# Patient Record
Sex: Female | Born: 1970 | Race: White | Hispanic: No | Smoking: Never smoker
Health system: Southern US, Community
[De-identification: ages and names within clinical notes are randomized; demographics above are authoritative.]

---

## 2005-01-04 ENCOUNTER — Other Ambulatory Visit: Admission: RE | Admit: 2005-01-04 | Discharge: 2005-01-04 | Payer: Self-pay | Admitting: Obstetrics and Gynecology

## 2006-02-12 ENCOUNTER — Other Ambulatory Visit: Admission: RE | Admit: 2006-02-12 | Discharge: 2006-02-12 | Payer: Self-pay | Admitting: Obstetrics and Gynecology

## 2007-07-22 ENCOUNTER — Other Ambulatory Visit: Admission: RE | Admit: 2007-07-22 | Discharge: 2007-07-22 | Payer: Self-pay | Admitting: Obstetrics and Gynecology

## 2008-03-09 ENCOUNTER — Emergency Department (HOSPITAL_COMMUNITY): Admission: EM | Admit: 2008-03-09 | Discharge: 2008-03-10 | Payer: Self-pay | Admitting: Emergency Medicine

## 2008-08-11 ENCOUNTER — Other Ambulatory Visit: Admission: RE | Admit: 2008-08-11 | Discharge: 2008-08-11 | Payer: Self-pay | Admitting: Obstetrics and Gynecology

## 2009-01-14 ENCOUNTER — Other Ambulatory Visit: Admission: RE | Admit: 2009-01-14 | Discharge: 2009-01-14 | Payer: Self-pay | Admitting: Obstetrics and Gynecology

## 2009-04-20 ENCOUNTER — Emergency Department: Payer: Self-pay | Admitting: Emergency Medicine

## 2009-11-02 ENCOUNTER — Inpatient Hospital Stay (HOSPITAL_COMMUNITY): Admission: AD | Admit: 2009-11-02 | Discharge: 2009-11-05 | Payer: Self-pay | Admitting: Obstetrics and Gynecology

## 2009-12-06 ENCOUNTER — Ambulatory Visit: Payer: Self-pay | Admitting: Pediatrics

## 2010-07-24 ENCOUNTER — Ambulatory Visit: Payer: Self-pay | Admitting: Unknown Physician Specialty

## 2011-04-04 LAB — CBC
HCT: 31 % — ABNORMAL LOW (ref 36.0–46.0)
Hemoglobin: 10.4 g/dL — ABNORMAL LOW (ref 12.0–15.0)
Platelets: 190 10*3/uL (ref 150–400)
RBC: 3.17 MIL/uL — ABNORMAL LOW (ref 3.87–5.11)
RBC: 3.67 MIL/uL — ABNORMAL LOW (ref 3.87–5.11)
WBC: 10 10*3/uL (ref 4.0–10.5)
WBC: 16.8 10*3/uL — ABNORMAL HIGH (ref 4.0–10.5)

## 2011-04-04 LAB — RPR: RPR Ser Ql: NONREACTIVE

## 2012-01-02 ENCOUNTER — Other Ambulatory Visit: Payer: Self-pay

## 2012-01-07 LAB — EXPECTORATED SPUTUM ASSESSMENT W GRAM STAIN, RFLX TO RESP C

## 2012-08-14 ENCOUNTER — Ambulatory Visit: Payer: Self-pay | Admitting: Unknown Physician Specialty

## 2013-07-17 ENCOUNTER — Other Ambulatory Visit: Payer: Self-pay | Admitting: Neurology

## 2014-03-11 ENCOUNTER — Other Ambulatory Visit: Payer: Self-pay

## 2014-03-11 ENCOUNTER — Other Ambulatory Visit: Payer: Self-pay | Admitting: Obstetrics and Gynecology

## 2014-03-11 DIAGNOSIS — Z1231 Encounter for screening mammogram for malignant neoplasm of breast: Secondary | ICD-10-CM

## 2014-03-25 ENCOUNTER — Ambulatory Visit
Admission: RE | Admit: 2014-03-25 | Discharge: 2014-03-25 | Disposition: A | Discharge status: Home / Self Care | Payer: Self-pay | Source: Ambulatory Visit | Attending: Obstetrics and Gynecology | Admitting: Obstetrics and Gynecology

## 2014-03-25 ENCOUNTER — Other Ambulatory Visit: Payer: Self-pay

## 2014-03-25 ENCOUNTER — Ambulatory Visit
Admission: RE | Admit: 2014-03-25 | Discharge: 2014-03-25 | Disposition: A | Discharge status: Home / Self Care | Payer: BC Managed Care – PPO | Source: Ambulatory Visit

## 2014-03-25 DIAGNOSIS — Z1231 Encounter for screening mammogram for malignant neoplasm of breast: Secondary | ICD-10-CM

## 2016-06-13 ENCOUNTER — Telehealth: Payer: Self-pay

## 2016-06-13 NOTE — Telephone Encounter (Signed)
Not an active patient- last seen 2013.  NO time for review.
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
## 2016-06-14 NOTE — Telephone Encounter (Signed)
I spoke to [place] at [employer] [employer] [place] [employer]. I advised her that Dr. Vickey Hugerohmeier cannot review the records. I also reminded her that pt was not seen in 2016 by [facility], we last saw the pt in 2013. Shanda BumpsJessica says that she is aware of this mistake.

## 2021-06-29 ENCOUNTER — Telehealth: Payer: Self-pay | Admitting: Neurology

## 2021-06-29 NOTE — Telephone Encounter (Signed)
Patient is being referred for chronic migraines. Last seen Dr. Vickey Huger in 2013. Also saw Dr. Caryn Section and [facility] neurology in 2020. Patient is requesting to switch to Dr. Lucia Gaskins. Please advise if this is acceptable. Thank you

## 2021-10-05 ENCOUNTER — Ambulatory Visit: Payer: Commercial Managed Care - PPO | Admitting: Neurology

## 2021-10-25 ENCOUNTER — Other Ambulatory Visit: Payer: Self-pay | Admitting: Family Medicine

## 2021-10-25 DIAGNOSIS — Z1231 Encounter for screening mammogram for malignant neoplasm of breast: Secondary | ICD-10-CM

## 2021-11-28 ENCOUNTER — Ambulatory Visit
Admission: RE | Admit: 2021-11-28 | Discharge: 2021-11-28 | Disposition: A | Discharge status: Home / Self Care | Payer: Commercial Managed Care - PPO | Source: Ambulatory Visit | Attending: Family Medicine | Admitting: Family Medicine

## 2021-11-28 DIAGNOSIS — Z1231 Encounter for screening mammogram for malignant neoplasm of breast: Secondary | ICD-10-CM

## 2021-12-01 ENCOUNTER — Other Ambulatory Visit: Payer: Self-pay | Admitting: Family Medicine

## 2021-12-01 DIAGNOSIS — R928 Other abnormal and inconclusive findings on diagnostic imaging of breast: Secondary | ICD-10-CM

## 2021-12-21 ENCOUNTER — Ambulatory Visit
Admission: RE | Admit: 2021-12-21 | Discharge: 2021-12-21 | Disposition: A | Discharge status: Home / Self Care | Payer: Commercial Managed Care - PPO | Source: Ambulatory Visit | Attending: Family Medicine | Admitting: Family Medicine

## 2021-12-21 DIAGNOSIS — R928 Other abnormal and inconclusive findings on diagnostic imaging of breast: Secondary | ICD-10-CM

## 2022-10-03 ENCOUNTER — Other Ambulatory Visit (HOSPITAL_BASED_OUTPATIENT_CLINIC_OR_DEPARTMENT_OTHER): Payer: Self-pay

## 2022-10-03 MED ORDER — INFLUENZA VAC SPLIT QUAD 0.5 ML IM SUSY
PREFILLED_SYRINGE | INTRAMUSCULAR | 0 refills | Status: DC
  Filled 2022-10-03: qty 0.5, 1d supply, fill #0

## 2022-10-29 IMAGING — MG MM DIGITAL DIAGNOSTIC UNILAT*R* W/ TOMO W/ CAD
4 series · 4 of 12 positions shown · non-contrast
Comparison: Previous exam(s).

CLINICAL DATA: 42-year-old female recalled from screening mammogram
dated 11/28/2021 for a possible right breast mass.

EXAM:
DIGITAL DIAGNOSTIC UNILATERAL RIGHT MAMMOGRAM WITH TOMOSYNTHESIS AND
CAD; ULTRASOUND RIGHT BREAST LIMITED
TECHNIQUE: Right digital diagnostic mammography and breast tomosynthesis was
performed. The images were evaluated with computer-aided detection.;
Targeted ultrasound examination of the right breast was performed

[R CC synth-2D]
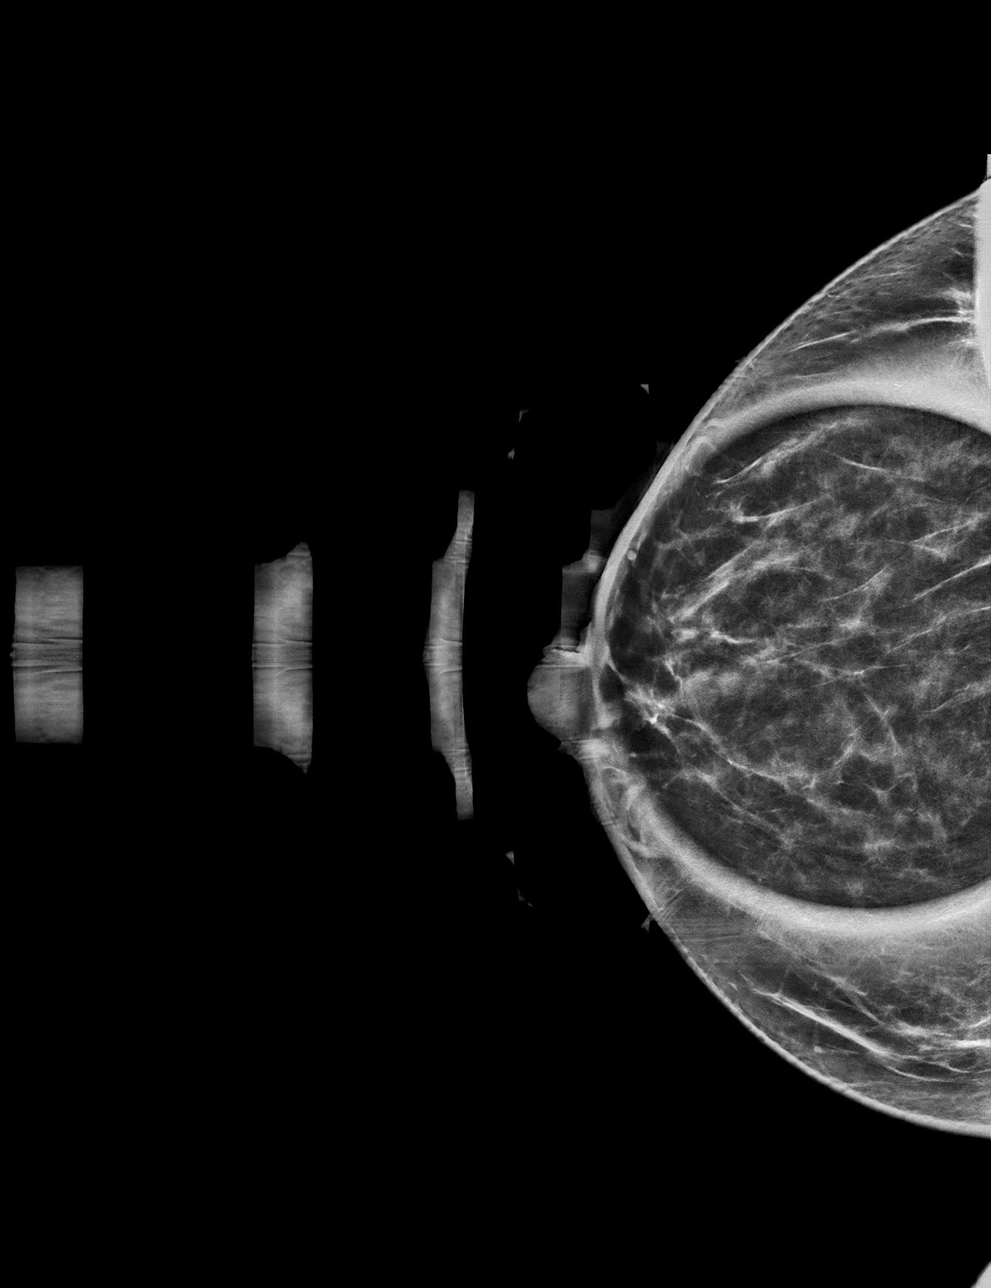

[R MLO synth-2D]
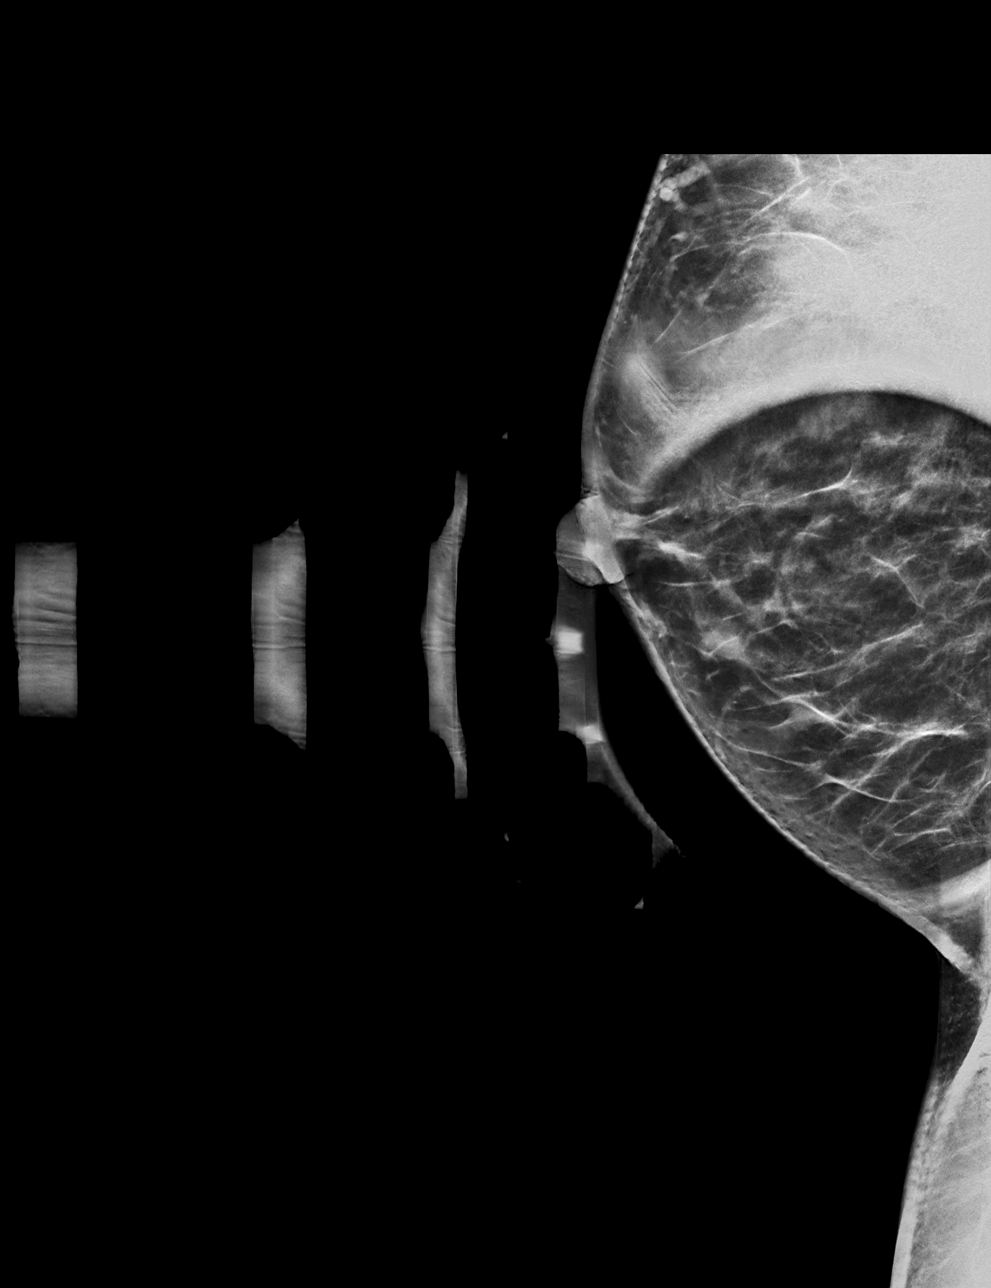

[R MLO tomo · tomo slice 30/59.0]
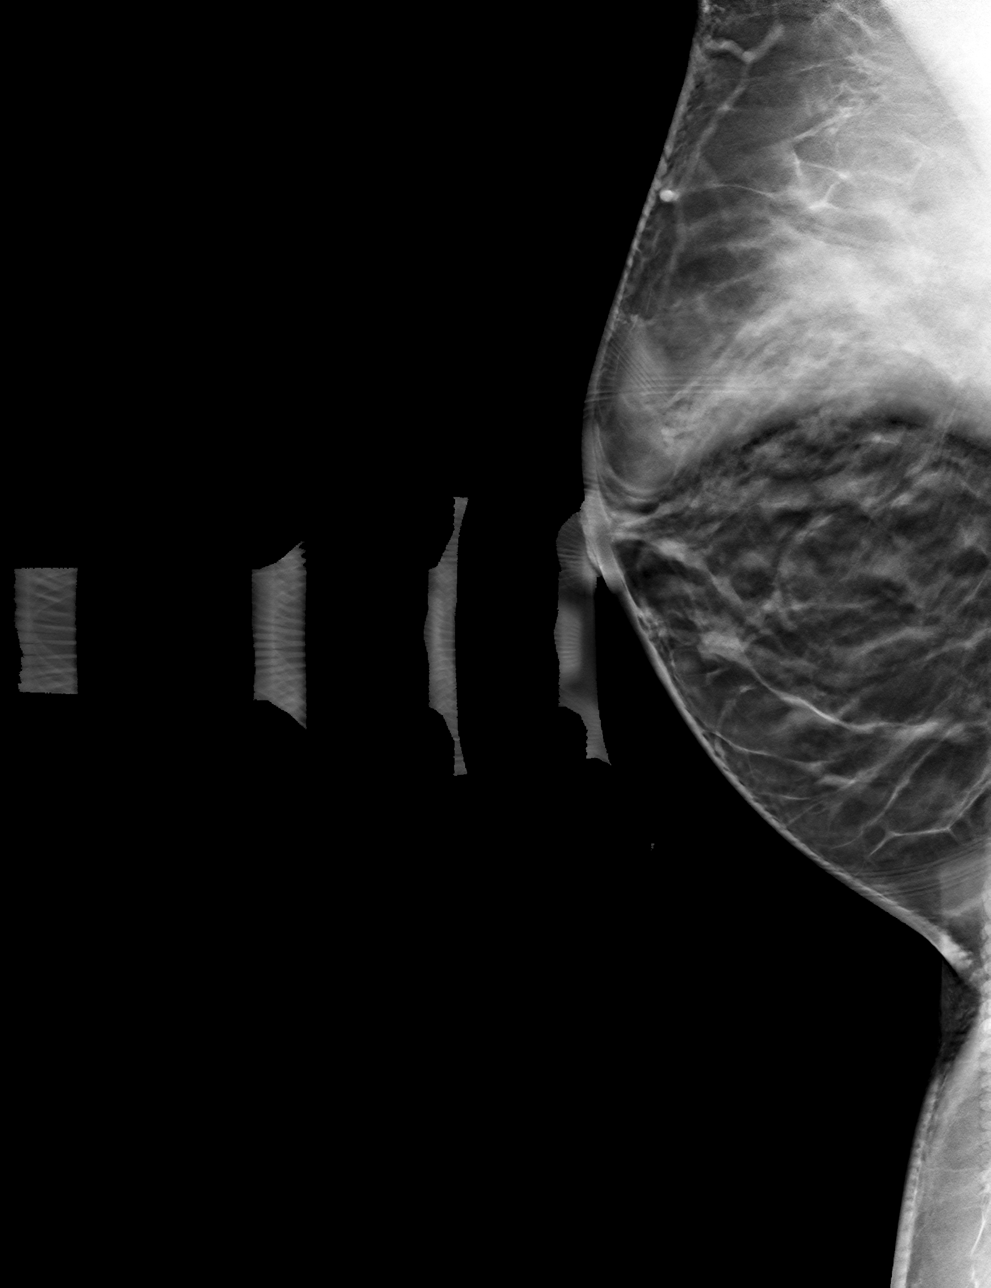

[R CC tomo · tomo slice 31/60.0]
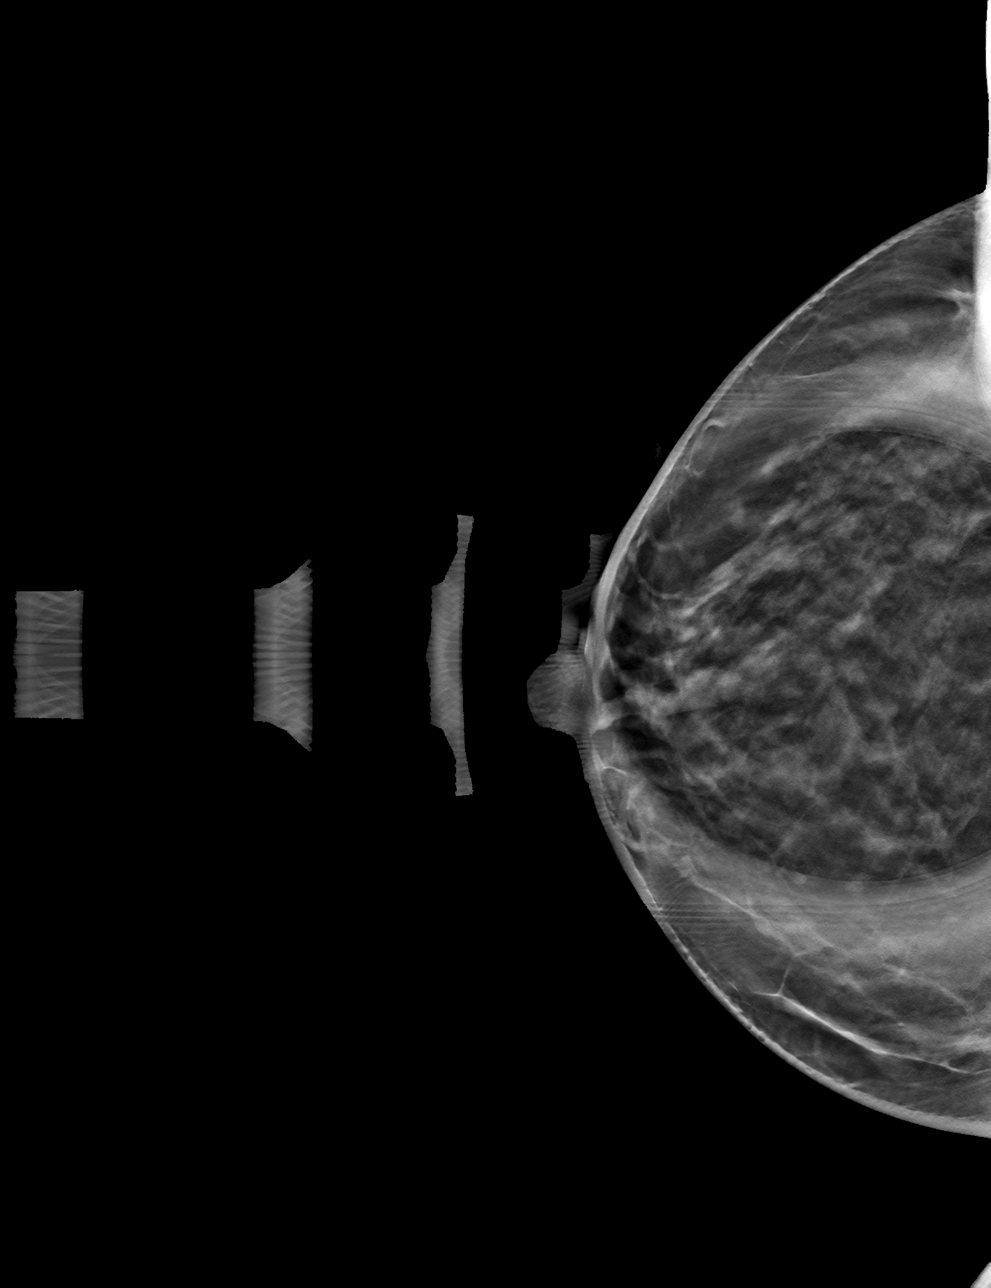

[4 of 12 positions shown; findings below may reference images not displayed]

ACR Breast Density Category c: The breast tissue is heterogeneously
dense, which may obscure small masses.
FINDINGS: There is a persistent serpentine, elongated circumscribed along
inferior right breast at anterior depth. Further evaluation with
ultrasound was performed.

Targeted ultrasound is performed, showing a dilated duct with areas
of focal outpouching along the 7 o'clock position 1 cm from the
nipple. This likely corresponds with the mammographic finding. No
additional suspicious findings throughout the remainder of the
inferior right breast. Numerous other minimally dilated ducts are
noted in the vicinity. No intraductal masses are identified.
IMPRESSION: Multiple benign dilated ducts along the subareolar right breast,
likely corresponding with the mammographic finding. No solid, cystic
or intraductal mass is identified.

RECOMMENDATION:
Screening mammogram in one year.(Code:[id number])

I have discussed the findings and recommendations with the patient.
If applicable, a reminder letter will be sent to the patient
regarding the next appointment.

BI-RADS CATEGORY  2: Benign.

## 2022-12-28 ENCOUNTER — Other Ambulatory Visit: Payer: Self-pay | Admitting: Physician Assistant

## 2022-12-28 DIAGNOSIS — Z1231 Encounter for screening mammogram for malignant neoplasm of breast: Secondary | ICD-10-CM

## 2023-02-18 ENCOUNTER — Ambulatory Visit
Admission: RE | Admit: 2023-02-18 | Discharge: 2023-02-18 | Disposition: A | Discharge status: Home / Self Care | Payer: Commercial Managed Care - PPO | Source: Ambulatory Visit | Attending: Physician Assistant | Admitting: Physician Assistant

## 2023-02-18 DIAGNOSIS — Z1231 Encounter for screening mammogram for malignant neoplasm of breast: Secondary | ICD-10-CM

## 2024-10-06 ENCOUNTER — Other Ambulatory Visit: Payer: Self-pay | Admitting: Adult Health Nurse Practitioner

## 2024-10-06 DIAGNOSIS — Z1231 Encounter for screening mammogram for malignant neoplasm of breast: Secondary | ICD-10-CM

## 2024-10-07 ENCOUNTER — Ambulatory Visit
Admission: RE | Admit: 2024-10-07 | Discharge: 2024-10-07 | Disposition: A | Discharge status: Home / Self Care | Source: Ambulatory Visit

## 2024-10-07 DIAGNOSIS — Z1231 Encounter for screening mammogram for malignant neoplasm of breast: Secondary | ICD-10-CM

## 2024-10-13 ENCOUNTER — Ambulatory Visit (HOSPITAL_COMMUNITY)
Admission: EM | Admit: 2024-10-13 | Discharge: 2024-10-13 | Disposition: A | Discharge status: Other Acute Inpatient Hospital | Attending: Nurse Practitioner | Admitting: Nurse Practitioner

## 2024-10-13 ENCOUNTER — Encounter (HOSPITAL_COMMUNITY): Payer: Self-pay | Admitting: Emergency Medicine

## 2024-10-13 DIAGNOSIS — G4710 Hypersomnia, unspecified: Secondary | ICD-10-CM | POA: Diagnosis not present

## 2024-10-13 DIAGNOSIS — Z79899 Other long term (current) drug therapy: Secondary | ICD-10-CM | POA: Diagnosis not present

## 2024-10-13 DIAGNOSIS — F411 Generalized anxiety disorder: Secondary | ICD-10-CM | POA: Diagnosis not present

## 2024-10-13 DIAGNOSIS — R45851 Suicidal ideations: Secondary | ICD-10-CM | POA: Insufficient documentation

## 2024-10-13 DIAGNOSIS — F332 Major depressive disorder, recurrent severe without psychotic features: Secondary | ICD-10-CM

## 2024-10-13 DIAGNOSIS — Z634 Disappearance and death of family member: Secondary | ICD-10-CM | POA: Diagnosis not present

## 2024-10-13 DIAGNOSIS — Z79890 Hormone replacement therapy (postmenopausal): Secondary | ICD-10-CM | POA: Diagnosis not present

## 2024-10-13 LAB — COMPREHENSIVE METABOLIC PANEL WITH GFR
ALT: 19 U/L (ref 0–44)
AST: 21 U/L (ref 15–41)
Albumin: 4 g/dL (ref 3.5–5.0)
Alkaline Phosphatase: 65 U/L (ref 38–126)
Anion gap: 15 (ref 5–15)
BUN: 16 mg/dL (ref 6–20)
CO2: 23 mmol/L (ref 22–32)
Calcium: 9.1 mg/dL (ref 8.9–10.3)
Chloride: 99 mmol/L (ref 98–111)
Creatinine, Ser: 0.77 mg/dL (ref 0.44–1.00)
GFR, Estimated: >60 mL/min (ref >60)
Glucose, Bld: 54 mg/dL — ABNORMAL LOW (ref 70–99)
Potassium: 4.6 mmol/L (ref 3.5–5.1)
Sodium: 137 mmol/L (ref 135–145)
Total Bilirubin: 0.9 mg/dL (ref 0.0–1.2)
Total Protein: 6.7 g/dL (ref 6.5–8.1)

## 2024-10-13 LAB — CBC WITH DIFFERENTIAL/PLATELET
Abs Immature Granulocytes: 0.02 K/uL (ref 0.00–0.07)
Basophils Absolute: 0 K/uL (ref 0.0–0.1)
Basophils Relative: 0 %
Eosinophils Absolute: 0.1 K/uL (ref 0.0–0.5)
Eosinophils Relative: 1 %
HCT: 45 % (ref 36.0–46.0)
Hemoglobin: 14.6 g/dL (ref 12.0–15.0)
Immature Granulocytes: 0 %
Lymphocytes Relative: 16 %
Lymphs Abs: 1.3 K/uL (ref 0.7–4.0)
MCH: 31.8 pg (ref 26.0–34.0)
MCHC: 32.4 g/dL (ref 30.0–36.0)
MCV: 98 fL (ref 80.0–100.0)
Monocytes Absolute: 0.7 K/uL (ref 0.1–1.0)
Monocytes Relative: 8 %
Neutro Abs: 6.1 K/uL (ref 1.7–7.7)
Neutrophils Relative %: 75 %
Platelets: 294 K/uL (ref 150–400)
RBC: 4.59 MIL/uL (ref 3.87–5.11)
RDW: 13.7 % (ref 11.5–15.5)
WBC: 8.2 K/uL (ref 4.0–10.5)
nRBC: 0 % (ref 0.0–0.2)

## 2024-10-13 LAB — POCT URINE DRUG SCREEN - MANUAL ENTRY (I-SCREEN)
POC Amphetamine UR: NOT DETECTED
POC Buprenorphine (BUP): NOT DETECTED
POC Cocaine UR: NOT DETECTED
POC Marijuana UR: NOT DETECTED
POC Methadone UR: NOT DETECTED
POC Methamphetamine UR: NOT DETECTED
POC Morphine: NOT DETECTED
POC Oxazepam (BZO): NOT DETECTED
POC Oxycodone UR: NOT DETECTED
POC Secobarbital (BAR): NOT DETECTED

## 2024-10-13 LAB — URINALYSIS, ROUTINE W REFLEX MICROSCOPIC
Bacteria, UA: NONE SEEN
Bilirubin Urine: NEGATIVE
Glucose, UA: NEGATIVE mg/dL
Ketones, ur: 5 mg/dL — AB
Leukocytes,Ua: NEGATIVE
Nitrite: NEGATIVE
Protein, ur: NEGATIVE mg/dL
Specific Gravity, Urine: 1.015 (ref 1.005–1.030)
pH: 6 (ref 5.0–8.0)

## 2024-10-13 LAB — LIPID PANEL
Cholesterol: 242 mg/dL — ABNORMAL HIGH (ref 0–200)
HDL: 104 mg/dL (ref >40)
LDL Cholesterol: 122 mg/dL — ABNORMAL HIGH (ref 0–99)
Total CHOL/HDL Ratio: 2.3 ratio
Triglycerides: 79 mg/dL (ref <150)
VLDL: 16 mg/dL (ref 0–40)

## 2024-10-13 LAB — POC URINE PREG, ED: Preg Test, Ur: NEGATIVE

## 2024-10-13 LAB — TSH: TSH: 1.526 u[IU]/mL (ref 0.350–4.500)

## 2024-10-13 LAB — HEMOGLOBIN A1C
Hgb A1c MFr Bld: 4.7 % — ABNORMAL LOW (ref 4.8–5.6)
Mean Plasma Glucose: 88.19 mg/dL

## 2024-10-13 LAB — VITAMIN B12: Vitamin B-12: 463 pg/mL (ref 180–914)

## 2024-10-13 LAB — ETHANOL: Alcohol, Ethyl (B): <15 mg/dL (ref <15)

## 2024-10-13 LAB — VITAMIN D 25 HYDROXY (VIT D DEFICIENCY, FRACTURES): Vit D, 25-Hydroxy: 58.15 ng/mL (ref 30–100)

## 2024-10-13 LAB — MAGNESIUM: Magnesium: 2 mg/dL (ref 1.7–2.4)

## 2024-10-13 MED ORDER — DIPHENHYDRAMINE HCL 50 MG/ML IJ SOLN: 50.0000 mg | Freq: Three times a day (TID) | INTRAMUSCULAR | Status: DC | PRN

## 2024-10-13 MED ORDER — DIPHENHYDRAMINE HCL 50 MG PO CAPS: 50.0000 mg | ORAL_CAPSULE | Freq: Three times a day (TID) | ORAL | Status: DC | PRN

## 2024-10-13 MED ORDER — HALOPERIDOL 5 MG PO TABS: 5.0000 mg | ORAL_TABLET | Freq: Three times a day (TID) | ORAL | Status: DC | PRN

## 2024-10-13 MED ORDER — ACETAMINOPHEN 325 MG PO TABS: 650.0000 mg | ORAL_TABLET | Freq: Four times a day (QID) | ORAL | Status: DC | PRN

## 2024-10-13 MED ORDER — ALUM & MAG HYDROXIDE-SIMETH 200-200-20 MG/5ML PO SUSP: 30.0000 mL | ORAL | Status: DC | PRN

## 2024-10-13 MED ORDER — MAGNESIUM HYDROXIDE 400 MG/5ML PO SUSP: 30.0000 mL | Freq: Every day | ORAL | Status: DC | PRN

## 2024-10-13 MED ORDER — LORAZEPAM 2 MG/ML IJ SOLN: 2.0000 mg | Freq: Three times a day (TID) | INTRAMUSCULAR | Status: DC | PRN

## 2024-10-13 MED ORDER — HALOPERIDOL LACTATE 5 MG/ML IJ SOLN: 5.0000 mg | Freq: Three times a day (TID) | INTRAMUSCULAR | Status: DC | PRN

## 2024-10-13 MED ORDER — FLUOXETINE HCL 20 MG PO CAPS: 40.0000 mg | ORAL_CAPSULE | Freq: Every day | ORAL | Status: DC

## 2024-10-13 MED ORDER — HYDROXYZINE HCL 25 MG PO TABS: 25.0000 mg | ORAL_TABLET | Freq: Three times a day (TID) | ORAL | Status: DC | PRN

## 2024-10-13 MED ORDER — CLONAZEPAM 0.5 MG PO TABS: 0.5000 mg | ORAL_TABLET | Freq: Two times a day (BID) | ORAL | Status: DC | PRN

## 2024-10-13 MED ORDER — TRAZODONE HCL 50 MG PO TABS: 50.0000 mg | ORAL_TABLET | Freq: Every evening | ORAL | Status: DC | PRN

## 2024-10-13 MED ORDER — HALOPERIDOL LACTATE 5 MG/ML IJ SOLN: 10.0000 mg | Freq: Three times a day (TID) | INTRAMUSCULAR | Status: DC | PRN

## 2024-10-13 NOTE — ED Provider Notes (Signed)
 [facility] Continuous Assessment Admission H&P  Date: 10/13/24 Patient Name: Caitlin Castro MRN: [id number] Chief Complaint: Worsening depressive symptoms with suicidal ideations  Diagnoses:  Final diagnoses:  MDD (major depressive disorder), recurrent severe, without psychosis (HCC)  GAD (generalized anxiety disorder)   HPI: Caitlin Castro is a 54 y.o. female with a reported prior mental health diagnoses of MDD and GAD who presents to the [facility] today accompanied by her cousin with complaints of worsening depressive symptoms and anxiety in the context of husband passing away of pancreatic CA in January of this year.  Assessment: During encounter with patient, she reports suicidal ideations with a plan to overdose on pills, reports that depressive symptoms have worsened in the past month, and she began having plans to overdose in the past week.  Patient reports depressive symptoms including hypersomnia, but still feeling very tired.  Reports anhedonia, trouble enjoying laughter, trouble enjoying pickleball which is something that she typically enjoys to do.  Reports decreased energy levels, poor concentration levels, reports weight loss of 25 pounds since January of this year.  Reports that this is related to the decrease in appetite.  Reports trouble thinking clearly, with worsening brain fog.  Reports racing thoughts, shares that earlier today morning, she reached out to her cousin who accompanies her today and told her that she had reached the end of her rope, and could not take it any longer, she had the SI with plan with this cousin who is also an NP, leading to her being brought to the [facility] today.  Patient also reports symptoms consistent with GAD including restlessness, overly worrying, muscle tension, denies symptoms consistent with panic attacks.  Denies psychosis; specifically denies auditory or visual hallucinations, denies paranoia or delusional  thinking, there are no overt signs of psychosis.  Denies first rank symptoms.  Denies mania consistent with bipolar 1 or bipolar type II.  Patient reports drinking alcohol 1 day out of the week, consisting of 2 drinks of bourbon each time.  Denies any other substance use not mentioned here.  Denies any history of abuse.  Reports current stressor is missing her husband, reports that she got busy juggling her 36 year old daughter between volleyball practices, [place], as she was engaged in travel volleyball, and has now had time to process the loss of her husband, and is just beginning to grieve.  Patient reports that she moved from [place]  to this area, stopped working when her husband was diagnosed with pancreatic CA, has not worked since he passed away. Reports started duloxetine at 30 mg daily when her husband was newly diagnosed with pancreatic CA, then Fluoxetine was added to medication regimen in January of this year after he passed away.  Reports that Klonopin was also added which she takes on a as needed basis, and only takes it twice per week.    Patient reports that she is on hormone replacement therapy, which might be adding to her depressive symptoms.  Denies other symptoms consistent with any other mental health conditions.  Recommendations: Suicide Risk Assessment  SUICIDE RISK:  Severe:  Frequent, intense, and enduring suicidal ideation, specific plan prior to hospitalization, no subjective intent, but some objective markers of intent (i.e., choice of lethal method), the method is accessible, some limited preparatory behavior, evidence of impaired self-control, severe dysphoria/symptomatology, multiple risk factors present, even though she has some protective factors, and also social support present.  Patient also has a significant family  history of suicide, and reveals that her sister completed suicide in the context of a bad marriage 10 years ago.  Patient has a  therapist outpatient, but currently is not seeing an outpatient provider for medication management.  She is being seen by her PCP for mental health medication management.  Patient is still at high risk for suicide  Total Time spent with patient: 1.5 hours  Musculoskeletal  Strength & Muscle Tone: within normal limits Gait & Station: normal Patient leans: N/A  Psychiatric Specialty Exam  Presentation General Appearance: Casual  Eye Contact:Fair  Speech:Clear and Coherent  Speech Volume:Normal  Handedness:Right   Mood and Affect  Mood:Depressed  Affect:Appropriate; Congruent   Thought Process  Thought Processes:Coherent  Descriptions of Associations:Intact  Orientation:Full (Time, Place and Person)  Thought Content:Logical    Hallucinations:Hallucinations: None  Ideas of Reference:No data recorded Suicidal Thoughts:Suicidal Thoughts: Yes, Active SI Active Intent and/or Plan: With Plan  Homicidal Thoughts:Homicidal Thoughts: No   Sensorium  Memory:Immediate Fair  Judgment:Good  Insight:Good   Executive Functions  Concentration:Good  Attention Span:Good  Recall:Good  Fund of Knowledge:Good  Language:Good   Psychomotor Activity  Psychomotor Activity:Psychomotor Activity: Normal   Assets  Assets:Desire for Improvement   Sleep  Sleep:Sleep: Poor   Nutritional Assessment (For OBS and [facility]BC admissions only) Has the patient had a weight loss or gain of 10 pounds or more in the last 3 months?: No Has the patient had a decrease in food intake/or appetite?: No Does the patient have dental problems?: No Does the patient have eating habits or behaviors that may be indicators of an eating disorder including binging or inducing vomiting?: No Has the patient recently lost weight without trying?: 0    Physical Exam Vitals and nursing note reviewed.  Eyes:     Pupils: Pupils are equal, round, and reactive to light.  Musculoskeletal:         General: Normal range of motion.     Cervical back: Normal range of motion.  Neurological:     General: No focal deficit present.     Mental Status: She is oriented to person, place, and time.    Review of Systems  Psychiatric/Behavioral:  Positive for depression and suicidal ideas. Negative for hallucinations, memory loss and substance abuse. The patient is nervous/anxious and has insomnia.   All other systems reviewed and are negative.   Blood pressure (!) 120/100, pulse 66, temperature 97.8 F (36.6 C), temperature source Oral, resp. rate 16, SpO2 98%. There is no height or weight on file to calculate BMI.  Past Psychiatric History: MDD, GAD   Is the patient at risk to self? Yes  Has the patient been a risk to self in the past 6 months? Yes .    Has the patient been a risk to self within the distant past? No   Is the patient a risk to others? No   Has the patient been a risk to others in the past 6 months? No   Has the patient been a risk to others within the distant past? No   Past Medical History: denies  Family History: denies  Social History: Has children who are 31, 30 16, 41,14-the oldest 4 are step children.  Last Labs:  Admission on 10/13/2024  Component Date Value Ref Range Status   Preg Test, Ur 10/13/2024 Negative  Negative Final   POC Amphetamine UR 10/13/2024 None Detected  NONE DETECTED (Cut Off Level 1000 ng/mL) Final   POC Secobarbital (BAR) 10/13/2024  None Detected  NONE DETECTED (Cut Off Level 300 ng/mL) Final   POC Buprenorphine (BUP) 10/13/2024 None Detected  NONE DETECTED (Cut Off Level 10 ng/mL) Final   POC Oxazepam (BZO) 10/13/2024 None Detected  NONE DETECTED (Cut Off Level 300 ng/mL) Final   POC Cocaine UR 10/13/2024 None Detected  NONE DETECTED (Cut Off Level 300 ng/mL) Final   POC Methamphetamine UR 10/13/2024 None Detected  NONE DETECTED (Cut Off Level 1000 ng/mL) Final   POC Morphine 10/13/2024 None Detected  NONE DETECTED (Cut Off Level 300  ng/mL) Final   POC Methadone UR 10/13/2024 None Detected  NONE DETECTED (Cut Off Level 300 ng/mL) Final   POC Oxycodone UR 10/13/2024 None Detected  NONE DETECTED (Cut Off Level 100 ng/mL) Final   POC Marijuana UR 10/13/2024 None Detected  NONE DETECTED (Cut Off Level 50 ng/mL) Final    Allergies: Patient has no allergy information on record.  Medications:  Facility Ordered Medications  Medication   acetaminophen (TYLENOL) tablet 650 mg   alum & mag hydroxide-simeth (MAALOX/MYLANTA) 200-200-20 MG/5ML suspension 30 mL   magnesium hydroxide (MILK OF MAGNESIA) suspension 30 mL   haloperidol (HALDOL) tablet 5 mg   And   diphenhydrAMINE (BENADRYL) capsule 50 mg   haloperidol lactate (HALDOL) injection 5 mg   And   diphenhydrAMINE (BENADRYL) injection 50 mg   And   LORazepam (ATIVAN) injection 2 mg   haloperidol lactate (HALDOL) injection 10 mg   And   diphenhydrAMINE (BENADRYL) injection 50 mg   And   LORazepam (ATIVAN) injection 2 mg   hydrOXYzine (ATARAX) tablet 25 mg   traZODone (DESYREL) tablet 50 mg   PTA Medications  Medication Sig   topiramate (TOPAMAX) 25 MG tablet TAKE 1 TABLET BY MOUTH EVERY EVENING FOR 8 DAYS THEN INCREASE TO 1 TABLET TWICE DAILY THEREAFTER   influenza vac split quadrivalent PF (FLUARIX) 0.5 ML injection Inject into the muscle.   Medical Decision Making  -Recommended for inpatient treatment for stabilization of mental health status, accepted by the [facility]. -Baseline labs ordered including CMP, CBC, lipid panel, hemoglobin A1c, vitamin B1, D, urine pregnancy test, urine toxicology screen. - Agitation protocol medications -Continuing home medications, deferring management to the inpatient team.   Recommendations  Based on my evaluation the patient appears to have an emergency mental health condition for which I recommend the patient be transferred to an inpatient behavioral health unit for treatment and stabilization.   Donia Snell, NP 10/13/24   5:53 PM

## 2024-10-13 NOTE — ED Notes (Signed)
 Pt arrived to adult obs bed 1 from assessment.  Pt shares loss of husband to cancer x 1  year ago and increasing depression symptoms.  Pt stated she there was left over medication from when her husband was sick and she started thinking about it and it scared her.  Her family member brought her her.   Tearful at times  Denied current SI plan and  intent,  Denied Hi and A.V Hallucinations Q 15 minute observations in place for safety

## 2024-10-13 NOTE — ED Notes (Signed)
 Lab called, pt remains pending results of bloodwork.

## 2024-10-13 NOTE — Discharge Instructions (Signed)
 Please transfer to [facility] for a higher level of care

## 2024-10-13 NOTE — ED Notes (Signed)
 Safe Transport requested.

## 2024-10-13 NOTE — ED Notes (Signed)
 Report called to RN Slater, [facility].  Pending [facility] Transport, report called.
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
## 2024-10-13 NOTE — ED Notes (Signed)
 Pt remains pending lab results and transfer to [facility] [phone].

## 2024-10-13 NOTE — Progress Notes (Signed)
 Meal given
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
## 2024-10-13 NOTE — ED Notes (Signed)
 Pt A&O x 4 , distress noted.. Calm & cooperative.  MOnitoring for safety.  Pending lab results. & transfer to [facility].

## 2024-10-13 NOTE — Progress Notes (Signed)
 Pt has been accepted to [facility] on 10/13/2024 . Bed assignment: 304-1   Pt meets inpatient criteria per Donia Snell, NP  Attending Physician will be  Dr. Prentis   Report can be called to: - Adult unit: [phone]  Pt can arrive pending labs   Care Team Notified: [facility] Cherylynn Ernst. RN, Damien Fireman, RN, Suzen Potts, RN, Donia Snell, NP

## 2024-10-14 ENCOUNTER — Encounter (HOSPITAL_COMMUNITY): Payer: Self-pay

## 2024-10-14 ENCOUNTER — Encounter (HOSPITAL_COMMUNITY): Payer: Self-pay | Admitting: Student in an Organized Health Care Education/Training Program

## 2024-10-14 ENCOUNTER — Inpatient Hospital Stay (HOSPITAL_COMMUNITY)
Admission: AD | Admit: 2024-10-14 | Discharge: 2024-10-16 | DRG: 885 | Disposition: A | Discharge status: Home / Self Care | Source: Intra-hospital

## 2024-10-14 ENCOUNTER — Other Ambulatory Visit: Payer: Self-pay

## 2024-10-14 DIAGNOSIS — Z634 Disappearance and death of family member: Secondary | ICD-10-CM

## 2024-10-14 DIAGNOSIS — F332 Major depressive disorder, recurrent severe without psychotic features: Secondary | ICD-10-CM | POA: Diagnosis present

## 2024-10-14 DIAGNOSIS — F4381 Prolonged grief disorder: Secondary | ICD-10-CM | POA: Diagnosis present

## 2024-10-14 DIAGNOSIS — R45851 Suicidal ideations: Secondary | ICD-10-CM | POA: Diagnosis present

## 2024-10-14 DIAGNOSIS — Z79899 Other long term (current) drug therapy: Secondary | ICD-10-CM | POA: Diagnosis not present

## 2024-10-14 DIAGNOSIS — F411 Generalized anxiety disorder: Secondary | ICD-10-CM | POA: Diagnosis present

## 2024-10-14 DIAGNOSIS — Z800 Family history of malignant neoplasm of digestive organs: Secondary | ICD-10-CM

## 2024-10-14 DIAGNOSIS — E860 Dehydration: Secondary | ICD-10-CM | POA: Diagnosis present

## 2024-10-14 DIAGNOSIS — Z880 Allergy status to penicillin: Secondary | ICD-10-CM | POA: Diagnosis not present

## 2024-10-14 DIAGNOSIS — Z79890 Hormone replacement therapy (postmenopausal): Secondary | ICD-10-CM | POA: Diagnosis not present

## 2024-10-14 DIAGNOSIS — Z90710 Acquired absence of both cervix and uterus: Secondary | ICD-10-CM

## 2024-10-14 DIAGNOSIS — G43909 Migraine, unspecified, not intractable, without status migrainosus: Secondary | ICD-10-CM | POA: Diagnosis present

## 2024-10-14 MED ORDER — MAGNESIUM HYDROXIDE 400 MG/5ML PO SUSP: 30.0000 mL | Freq: Every day | ORAL | Status: DC | PRN

## 2024-10-14 MED ORDER — ALUM & MAG HYDROXIDE-SIMETH 200-200-20 MG/5ML PO SUSP: 30.0000 mL | ORAL | Status: DC | PRN

## 2024-10-14 MED ORDER — LORAZEPAM 2 MG/ML IJ SOLN: 2.0000 mg | Freq: Three times a day (TID) | INTRAMUSCULAR | Status: DC | PRN

## 2024-10-14 MED ORDER — HYDROXYZINE HCL 25 MG PO TABS: 25.0000 mg | ORAL_TABLET | Freq: Three times a day (TID) | ORAL | Status: DC | PRN

## 2024-10-14 MED ORDER — ACETAMINOPHEN 325 MG PO TABS
650.0000 mg | ORAL_TABLET | Freq: Four times a day (QID) | ORAL | Status: DC | PRN
  Administered 2024-10-15: 650 mg via ORAL
  Filled 2024-10-14 (×4): qty 2

## 2024-10-14 MED ORDER — HALOPERIDOL LACTATE 5 MG/ML IJ SOLN: 5.0000 mg | Freq: Three times a day (TID) | INTRAMUSCULAR | Status: DC | PRN

## 2024-10-14 MED ORDER — HALOPERIDOL LACTATE 5 MG/ML IJ SOLN: 10.0000 mg | Freq: Three times a day (TID) | INTRAMUSCULAR | Status: DC | PRN

## 2024-10-14 MED ORDER — DIPHENHYDRAMINE HCL 50 MG/ML IJ SOLN: 50.0000 mg | Freq: Three times a day (TID) | INTRAMUSCULAR | Status: DC | PRN

## 2024-10-14 MED ORDER — DIPHENHYDRAMINE HCL 25 MG PO CAPS: 50.0000 mg | ORAL_CAPSULE | Freq: Three times a day (TID) | ORAL | Status: DC | PRN

## 2024-10-14 MED ORDER — ACETAMINOPHEN 325 MG PO TABS
650.0000 mg | ORAL_TABLET | Freq: Four times a day (QID) | ORAL | Status: DC | PRN
  Administered 2024-10-14 – 2024-10-16 (×3): 650 mg via ORAL

## 2024-10-14 MED ORDER — HALOPERIDOL 5 MG PO TABS: 5.0000 mg | ORAL_TABLET | Freq: Three times a day (TID) | ORAL | Status: DC | PRN

## 2024-10-14 MED ORDER — TRAZODONE HCL 50 MG PO TABS
50.0000 mg | ORAL_TABLET | Freq: Every evening | ORAL | Status: DC | PRN
  Administered 2024-10-14 – 2024-10-15 (×3): 50 mg via ORAL
  Filled 2024-10-14 (×3): qty 1

## 2024-10-14 MED ORDER — FLUOXETINE HCL 20 MG PO CAPS
40.0000 mg | ORAL_CAPSULE | Freq: Every day | ORAL | Status: DC
  Administered 2024-10-14 – 2024-10-16 (×3): 40 mg via ORAL
  Filled 2024-10-14 (×3): qty 2

## 2024-10-14 MED ORDER — CLONAZEPAM 0.5 MG PO TABS
0.5000 mg | ORAL_TABLET | Freq: Two times a day (BID) | ORAL | Status: DC | PRN
  Administered 2024-10-14 – 2024-10-16 (×4): 0.5 mg via ORAL
  Filled 2024-10-14 (×4): qty 1

## 2024-10-14 NOTE — Group Note (Signed)
 Recreation Therapy Group Note   Group Topic:Problem Solving  Group Date: 10/14/2024 Start Time: 0940 End Time: 1010 Facilitators: Levy Wellman-McCall, LRT,CTRS Location: [address] Dayroom   Group Topic: Communication, Team Building, Problem Solving   Goal Area(s) Addresses:  Patient will effectively work with peer towards shared goal.  Patient will identify skills used to make activity successful.  Patient will identify how skills used during activity can be used to reach post d/c goals.    Behavioral Response:    Intervention: STEM Activity   Activity: Landing Pad. In teams of 3-5, patients were given 12 plastic drinking straws and an equal length of masking tape. Using the materials provided, patients were asked to build a landing pad to catch a golf ball dropped from approximately 5 feet in the air. All materials were required to be used by the team in their design. LRT facilitated post-activity discussion.   Education: Pharmacist, community, Scientist, physiological, Discharge Planning    Education Outcome: Acknowledges education/In group clarification offered/Needs additional education.    Affect/Mood: N/A   Participation Level: Did not attend    Clinical Observations/Individualized Feedback:      Plan: Continue to engage patient in RT group sessions 2-3x/week.   Ociel Retherford-McCall, LRT,CTRS 10/14/2024 11:28 AM

## 2024-10-14 NOTE — Progress Notes (Signed)
(  Sleep Hours) -4.25 (Any PRNs that were needed, meds refused, or side effects to meds)- Trazodone (Any disturbances and when (visitation, over night)- n/a (Concerns raised by the patient)- none (SI/HI/AVH)- denies

## 2024-10-14 NOTE — Progress Notes (Signed)
   10/14/24 0900  Psych Admission Type (Psych Patients Only)  Admission Status Voluntary  Psychosocial Assessment  Patient Complaints Anxiety  Eye Contact Fair  Facial Expression Anxious  Affect Depressed  Speech Logical/coherent  Interaction Assertive  Motor Activity Slow  Appearance/Hygiene In scrubs  Behavior Characteristics Cooperative  Mood Depressed  Thought Process  Coherency WDL  Content WDL  Delusions None reported or observed  Perception WDL  Hallucination None reported or observed  Judgment Poor  Confusion None  Danger to Self  Current suicidal ideation? Denies  Description of Suicide Plan none  Self-Injurious Behavior No self-injurious ideation or behavior indicators observed or expressed   Agreement Not to Harm Self Yes  Description of Agreement verbal  Danger to Others  Danger to Others None reported or observed
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
## 2024-10-14 NOTE — Tx Team (Signed)
 Initial Treatment Plan 10/14/2024 1:32 AM [name] [id number]    PATIENT STRESSORS: Loss of husband though death   Substance abuse   Traumatic event     PATIENT STRENGTHS: Active sense of humor  Average or above average intelligence  Physical Health  Supportive family/friends    PATIENT IDENTIFIED PROBLEMS: Anxious  Depression   Get my medications adjusted   Better communication with daughter when upset               DISCHARGE CRITERIA:  Improved stabilization in mood, thinking, and/or behavior Reduction of life-threatening or endangering symptoms to within safe limits Verbal commitment to aftercare and medication compliance  PRELIMINARY DISCHARGE PLAN: Attend PHP/IOP Outpatient therapy Return to previous living arrangement  PATIENT/FAMILY INVOLVEMENT: This treatment plan has been presented to and reviewed with the patient, Caitlin Castro.  The patient and family have been given the opportunity to ask questions and make suggestions.  Slater MALVA Edin, RN 10/14/2024, 1:32 AM

## 2024-10-14 NOTE — Progress Notes (Signed)
 Caitlin Castro is a 54 y.o. female voluntarily admitted for worsening anxiety and depressive symptoms since she lost her husband to pancreatic cancer in January 2025.  She reports she has been struggling trying to maintain in her role as mother and grandmother lately.  Patient states she's been very busy with her daughter's national travel team since the week she lost her husband.  Patient reports worsening depressive symptoms of anergia, poor sleep and appetite, low motivation, difficulty concentrating and hopelessness.  Patient states she has been having suicidal ideation with a plan to overdose.  Pt has been calm, alert and cooperative with admission process. Denies SI/HI, AVH and verbally contracted for safety. Consents signed, skin/belongings search completed and pt oriented to unit. Pt stable at this time. Pt given the opportunity to express concerns and ask questions. Pt given toiletries. Will continue to monitor.

## 2024-10-14 NOTE — Progress Notes (Signed)

## 2024-10-14 NOTE — Plan of Care (Signed)
  Problem: Education: Goal: Knowledge of [place] [facility] Education information/materials will improve Outcome: Progressing Goal: Emotional status will improve Outcome: Progressing Goal: Verbalization of understanding the information provided will improve Outcome: Progressing   Problem: Activity: Goal: Sleeping patterns will improve Outcome: Progressing

## 2024-10-14 NOTE — Group Note (Signed)
 Date:  10/14/2024 Time:  4:15 PM  Group Topic/Focus:  Personal Choices and Values:   The focus of this group is to help patients assess and explore the importance of values in their lives, how their values affect their decisions, how they express their values and what opposes their expression.    Participation Level:  Did Not Attend   Huel Mall 10/14/2024, 4:15 PM

## 2024-10-14 NOTE — Plan of Care (Signed)
   Problem: Education: Goal: Knowledge of [facility] Education information/materials will improve Outcome: Progressing   Problem: Activity: Goal: Interest or engagement in activities will improve Outcome: Progressing   Problem: Coping: Goal: Ability to verbalize frustrations and anger appropriately will improve Outcome: Progressing   Problem: Safety: Goal: Periods of time without injury will increase Outcome: Progressing
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
## 2024-10-14 NOTE — BH IP Treatment Plan (Signed)
 Interdisciplinary Treatment and Diagnostic Plan Update  10/14/2024 Time of Session: 1030AM Caitlin Castro MRN: [id number]  Principal Diagnosis: MDD (major depressive disorder), recurrent severe, without psychosis (HCC)  Secondary Diagnoses: Principal Problem:   MDD (major depressive disorder), recurrent severe, without psychosis (HCC)   Current Medications:  Current Facility-Administered Medications  Medication Dose Route Frequency Provider Last Rate Last Admin   acetaminophen (TYLENOL) tablet 650 mg  650 mg Oral Q6H PRN Tex Drilling, NP       acetaminophen (TYLENOL) tablet 650 mg  650 mg Oral Q6H PRN Nkwenti, Doris, NP       alum & mag hydroxide-simeth (MAALOX/MYLANTA) 200-200-20 MG/5ML suspension 30 mL  30 mL Oral Q4H PRN Nkwenti, Doris, NP       alum & mag hydroxide-simeth (MAALOX/MYLANTA) 200-200-20 MG/5ML suspension 30 mL  30 mL Oral Q4H PRN Tex Drilling, NP       clonazePAM (KLONOPIN) tablet 0.5 mg  0.5 mg Oral BID PRN Tex Drilling, NP   0.5 mg at 10/14/24 0910   haloperidol (HALDOL) tablet 5 mg  5 mg Oral TID PRN Tex Drilling, NP       And   diphenhydrAMINE (BENADRYL) capsule 50 mg  50 mg Oral TID PRN Tex Drilling, NP       haloperidol lactate (HALDOL) injection 5 mg  5 mg Intramuscular TID PRN Tex Drilling, NP       And   diphenhydrAMINE (BENADRYL) injection 50 mg  50 mg Intramuscular TID PRN Tex Drilling, NP       And   LORazepam (ATIVAN) injection 2 mg  2 mg Intramuscular TID PRN Tex Drilling, NP       haloperidol lactate (HALDOL) injection 10 mg  10 mg Intramuscular TID PRN Tex Drilling, NP       And   diphenhydrAMINE (BENADRYL) injection 50 mg  50 mg Intramuscular TID PRN Tex Drilling, NP       And   LORazepam (ATIVAN) injection 2 mg  2 mg Intramuscular TID PRN Tex Drilling, NP       FLUoxetine (PROZAC) capsule 40 mg  40 mg Oral Daily Nkwenti, Doris, NP   40 mg at 10/14/24 0908   hydrOXYzine (ATARAX) tablet 25 mg  25 mg Oral TID PRN Tex Drilling,  NP       magnesium hydroxide (MILK OF MAGNESIA) suspension 30 mL  30 mL Oral Daily PRN Nkwenti, Doris, NP       magnesium hydroxide (MILK OF MAGNESIA) suspension 30 mL  30 mL Oral Daily PRN Tex Drilling, NP       traZODone (DESYREL) tablet 50 mg  50 mg Oral QHS PRN Tex Drilling, NP   50 mg at 10/14/24 0051   PTA Medications: Medications Prior to Admission  Medication Sig Dispense Refill Last Dose/Taking   acetaminophen (TYLENOL) 500 MG tablet Take 1,000 mg by mouth every 6 (six) hours as needed (For headache or pain).   10/10/2024   azelastine (ASTELIN) 0.1 % nasal spray Place 1 spray into both nostrils 2 (two) times daily as needed for allergies or rhinitis.   10/12/2024   clonazePAM (KLONOPIN) 0.5 MG tablet Take 0.5 mg by mouth 2 (two) times daily as needed for anxiety.   10/12/2024   DULoxetine (CYMBALTA) 30 MG capsule Take 30 mg by mouth daily.   10/13/2024   estradiol (DOTTI) 0.1 MG/24HR patch Place 1 patch onto the skin 2 (two) times a week. Applies on Wednesday and Sunday   10/11/2024   FLUoxetine (  PROZAC) 40 MG capsule Take 40 mg by mouth daily.   10/13/2024   Fremanezumab-vfrm (AJOVY) 225 MG/1.5ML SOSY Inject 225 mg into the skin every 30 (thirty) days. Takes on the 15th of each month   09/14/2024   naproxen sodium (ALEVE) 220 MG tablet Take 440 mg by mouth 2 (two) times daily as needed (For pain).   10/10/2024   valACYclovir (VALTREX) 500 MG tablet Take 500 mg by mouth daily.   10/12/2024    Patient Stressors: Loss of husband though death   Substance abuse   Traumatic event    Patient Strengths: Active sense of humor  Average or above average intelligence  Physical Health  Supportive family/friends   Treatment Modalities: Medication Management, Group therapy, Case management,  1 to 1 session with clinician, Psychoeducation, Recreational therapy.   Physician Treatment Plan for Primary Diagnosis: MDD (major depressive disorder), recurrent severe, without psychosis  (HCC) Long Term Goal(s):     Short Term Goals: Ability to identify changes in lifestyle to reduce recurrence of condition will improve Ability to verbalize feelings will improve Ability to identify and develop effective coping behaviors will improve  Medication Management: Evaluate patient's response, side effects, and tolerance of medication regimen.  Therapeutic Interventions: 1 to 1 sessions, Unit Group sessions and Medication administration.  Evaluation of Outcomes: Not Progressing  Physician Treatment Plan for Secondary Diagnosis: Principal Problem:   MDD (major depressive disorder), recurrent severe, without psychosis (HCC)  Long Term Goal(s):     Short Term Goals: Ability to identify changes in lifestyle to reduce recurrence of condition will improve Ability to verbalize feelings will improve Ability to identify and develop effective coping behaviors will improve     Medication Management: Evaluate patient's response, side effects, and tolerance of medication regimen.  Therapeutic Interventions: 1 to 1 sessions, Unit Group sessions and Medication administration.  Evaluation of Outcomes: Not Progressing   RN Treatment Plan for Primary Diagnosis: MDD (major depressive disorder), recurrent severe, without psychosis (HCC) Long Term Goal(s): Knowledge of disease and therapeutic regimen to maintain health will improve  Short Term Goals: Ability to remain free from injury will improve, Ability to verbalize frustration and anger appropriately will improve, Ability to demonstrate self-control, Ability to participate in decision making will improve, Ability to verbalize feelings will improve, Ability to disclose and discuss suicidal ideas, Ability to identify and develop effective coping behaviors will improve, and Compliance with prescribed medications will improve  Medication Management: RN will administer medications as ordered by provider, will assess and evaluate patient's  response and provide education to patient for prescribed medication. RN will report any adverse and/or side effects to prescribing provider.  Therapeutic Interventions: 1 on 1 counseling sessions, Psychoeducation, Medication administration, Evaluate responses to treatment, Monitor vital signs and CBGs as ordered, Perform/monitor CIWA, COWS, AIMS and Fall Risk screenings as ordered, Perform wound care treatments as ordered.  Evaluation of Outcomes: Not Progressing   LCSW Treatment Plan for Primary Diagnosis: MDD (major depressive disorder), recurrent severe, without psychosis (HCC) Long Term Goal(s): Safe transition to appropriate next level of care at discharge, Engage patient in therapeutic group addressing interpersonal concerns.  Short Term Goals: Engage patient in aftercare planning with referrals and resources, Increase social support, Increase ability to appropriately verbalize feelings, Increase emotional regulation, Facilitate acceptance of mental health diagnosis and concerns, Facilitate patient progression through stages of change regarding substance use diagnoses and concerns, Identify triggers associated with mental health/substance abuse issues, and Increase skills for wellness and recovery  Therapeutic Interventions:  Assess for all discharge needs, 1 to 1 time with Social worker, Explore available resources and support systems, Assess for adequacy in community support network, Educate family and significant other(s) on suicide prevention, Complete Psychosocial Assessment, Interpersonal group therapy.  Evaluation of Outcomes: Not Progressing   Progress in Treatment: Attending groups: No. Participating in groups: No. Taking medication as prescribed: Yes. Toleration medication: Yes. Family/Significant other contact made: No, will contact:  consents pending Patient understands diagnosis: Yes. Discussing patient identified problems/goals with staff: Yes. Medical problems stabilized  or resolved: Yes. Denies suicidal/homicidal ideation: Yes. Issues/concerns per patient self-inventory: No.  New problem(s) identified: No, Describe:  none  New Short Term/Long Term Goal(s): medication stabilization, elimination of SI thoughts, development of comprehensive mental wellness plan.    Patient Goals:  Medication adjustments for anxiety and depression, manage communication with my 54yo  Discharge Plan or Barriers: Patient recently admitted. CSW will continue to follow and assess for appropriate referrals and possible discharge planning.    Reason for Continuation of Hospitalization: Anxiety Depression Medication stabilization Suicidal ideation  Estimated Length of Stay: 5-7 days  Last 3 [place] Suicide Severity Risk Score: Flowsheet Row [facility]13/2024 in [facility]  C-SSRS RISK CATEGORY Low Risk Moderate Risk    Last PHQ 2/9 Scores:     No data to display          Scribe for Treatment Team: Jenkins LULLA Primer, LCSWA 10/14/2024 1:15 PM

## 2024-10-15 DIAGNOSIS — F4381 Prolonged grief disorder: Secondary | ICD-10-CM | POA: Diagnosis present

## 2024-10-15 LAB — PROLACTIN: Prolactin: 14.5 ng/mL (ref 3.6–25.2)

## 2024-10-15 MED ORDER — VALACYCLOVIR HCL 500 MG PO TABS
500.0000 mg | ORAL_TABLET | Freq: Every day | ORAL | Status: DC
  Administered 2024-10-15 – 2024-10-16 (×2): 500 mg via ORAL
  Filled 2024-10-15 (×2): qty 1

## 2024-10-15 MED ORDER — FREMANEZUMAB-VFRM 225 MG/1.5ML ~~LOC~~ SOAJ: 225.0000 mg | SUBCUTANEOUS | Status: DC

## 2024-10-15 MED ORDER — BUPROPION HCL ER (XL) 150 MG PO TB24
150.0000 mg | ORAL_TABLET | Freq: Every day | ORAL | Status: DC
  Administered 2024-10-15 – 2024-10-16 (×2): 150 mg via ORAL
  Filled 2024-10-15 (×2): qty 1

## 2024-10-15 MED ORDER — SUMATRIPTAN SUCCINATE 25 MG PO TABS: 25.0000 mg | ORAL_TABLET | Freq: Two times a day (BID) | ORAL | Status: DC | PRN

## 2024-10-15 NOTE — Group Note (Signed)
 Date:  10/15/2024 Time:  8:37 PM  Group Topic/Focus:  Wrap-Up Group:   The focus of this group is to help patients review their daily goal of treatment and discuss progress on daily workbooks.    Participation Level:  Active  Participation Quality:  Attentive  Affect:  Appropriate  Cognitive:  Appropriate  Insight: Appropriate and Good  Engagement in Group:  Engaged  Modes of Intervention:  Discussion  Additional Comments:  Patient stated she had an 8.5 out of 10 day.  Her goal is to work on herself   Bari Moats 10/15/2024, 8:37 PM

## 2024-10-15 NOTE — Progress Notes (Signed)
 Pt did not attend goals group.

## 2024-10-15 NOTE — Progress Notes (Signed)
   10/15/24 0900  Psych Admission Type (Psych Patients Only)  Admission Status Voluntary  Psychosocial Assessment  Patient Complaints Anxiety;Depression  Eye Contact Fair  Facial Expression Sad;Anxious  Affect Depressed  Speech Logical/coherent  Interaction Assertive  Motor Activity Slow  Appearance/Hygiene In scrubs  Behavior Characteristics Cooperative  Mood Depressed  Thought Process  Coherency WDL  Content WDL  Delusions None reported or observed  Perception WDL  Hallucination None reported or observed  Judgment Poor  Confusion None  Danger to Self  Current suicidal ideation? Denies  Agreement Not to Harm Self Yes  Description of Agreement verbal  Danger to Others  Danger to Others None reported or observed

## 2024-10-15 NOTE — Group Note (Signed)
 LCSW Group Therapy Note   Group Date: 10/15/2024 Start Time: 1100 End Time: 1200   Participation:  patient was present.  She listened and was respectful but didn't participate in the discussion.  Type of Therapy:  Group Therapy  Topic:  Shining from Within:  Confidence and Self-Love Journey  Objective:  To support participants in developing confidence and self-love through self-awareness, self-compassion, and practical skills that nurture personal growth.   Group Goals Encourage self-reflection and self-acceptance by identifying personal strengths and achievements. Teach skills to challenge negative self-talk and replace it with supportive, truthful self-talk. Foster resilience and self-worth through [employer], gratitude, and self-care practices.   Summary:  This group explores the connection between confidence and self-love by guiding participants through reflection, mindset shifts, and practical tools like affirmations, strength recognition, and goal-setting. Activities are designed to promote self-compassion, build emotional resilience, and normalize the slow, patient journey of inner growth.   Therapeutic Modalities Used: Cognitive Behavioral Therapy (CBT): Challenging and reframing unhelpful self-talk. Motivational Interviewing (MI): Encouraging small, achievable goals. Elements of Dialectical Behavioral Therapist (DBT):  Mindfulness and Self-Compassion: Promoting present-moment awareness and kindness toward self.   Pieper Kasik O Amarie Viles, LCSWA 10/15/2024  12:15 PM

## 2024-10-15 NOTE — Plan of Care (Signed)
  Problem: Education: Goal: Emotional status will improve Outcome: Progressing Goal: Verbalization of understanding the information provided will improve Outcome: Progressing   Problem: Activity: Goal: Interest or engagement in activities will improve Outcome: Progressing Goal: Sleeping patterns will improve Outcome: Progressing

## 2024-10-15 NOTE — Group Note (Signed)
 Occupational Therapy Group Note  Group Topic:Coping Skills  Group Date: 10/15/2024 Start Time: 1500 End Time: 1533 Facilitators: Dot [place] MATSU, OT   Group Description: Group encouraged increased engagement and participation through discussion and activity focused on Coping Ahead. Patients were split up into teams and selected a card from a stack of positive coping strategies. Patients were instructed to act out/charade the coping skill for other peers to guess and receive points for their team. Discussion followed with a focus on identifying additional positive coping strategies and patients shared how they were going to cope ahead over the weekend while continuing hospitalization stay.  Therapeutic Goal(s): Identify positive vs negative coping strategies. Identify coping skills to be used during hospitalization vs coping skills outside of hospital/at home Increase participation in therapeutic group environment and promote engagement in treatment   Participation Level: Engaged   Participation Quality: Independent   Behavior: Appropriate, Attentive , and Cooperative   Speech/Thought Process: Focused and Relevant   Affect/Mood: Appropriate and Euthymic   Insight: Good   Judgement: Good      Modes of Intervention: Education  Patient Response to Interventions:  Attentive   Plan: Continue to engage patient in OT groups 2 - 3x/week.  10/15/2024  [place] MATSU Dot, OT  Caitlin Castro, OT
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
## 2024-10-15 NOTE — Plan of Care (Signed)
   Problem: Education: Goal: Knowledge of [facility] Education information/materials will improve Outcome: Progressing Goal: Emotional status will improve Outcome: Progressing Goal: Mental status will improve Outcome: Progressing Goal: Verbalization of understanding the information provided will improve Outcome: Progressing

## 2024-10-15 NOTE — Progress Notes (Signed)
 [facility] Progress Note  Date: 10/15/2024 Patient: Caitlin Castro MRN: [id number]  Caitlin Castro is a 54 y.o. female  with a past psychiatric history of MDD and GAD. Patient initially arrived to [facility] on 10/14 for worsening depression and anxiety, and admitted to [facility] on 10/15 for acute safety concerns. PMHx is significant for chronic migraines, hormone replacement s/p bilateral oophorectomy and hysterectomy for advanced endometriosis, and anemia.   Assessment and Plan: [name] is a 54 y.o. F presenting with pervasive sadness, guilt, social withdrawal, fatigue, concentration difficulties, and identity disturbance tied to her roles as a wife, mother, and caregiver. These symptoms appear linked to her loss rather than representing a primary mood disorder such as MDD.   She is demonstrating good engagement in her treatment and active participation in discharge planning. Coordination for her migraine LAI medication is in progress, and family support remains strong. At this time, she appears psychiatrically stable and continues to benefit from inpatient monitoring for medication management and sustained mood stabilization.  # Complicated bereavement - Continue Prozac 40 mg daily for depression - Discontinue Cymbalta 30 mg - Start Wellbutrin 150 mg daily for depression - Continue Klonopin 0.5 mg twice daily as needed for anxiety   Risk Assessment: Patient continues to be gravely disabled due to severe and impairing depression and anxiety.  Discharge Planning: Barriers to Discharge: Medication management Estimated Length of Stay: 3-5 days Predicted Discharge Location: Home  Interval History: Chart reviewed. No significant events overnight. PRNs in the last 24 hours include Klonopin and trazodone.  Patient reports feeling significantly better after a supportive conversation with her parents, stating that they validated her emotions  similarly to her provider, Dr. Prentis. She expresses optimism about her support system and feels more grounded moving forward. She inquired about her long-acting injectable (LAI) migraine medication, noting that she is due for her next dose and would like to continue it during admission. We will coordinate with her cousin to arrange for a refill and administration. She denies suicidal ideation, homicidal ideation, and auditory or visual hallucinations.  Approval has been obtained for daily supervised visits with her daughter. Although her daughter has a Armed forces operational officer, plans are in place for a visit tomorrow accompanied by Aadya's adult cousin, Almarie Lye.  Collateral Meg Lye, via phone): Almarie agreed to refill and bring Nyima's migraine medication tonight. She was updated on Mazel's progress and was reassured that Irianna is doing well. Almarie stated that Aliza's daughter will visit tomorrow with their grandmother.  Physical Examination:  Vitals and nursing note reviewed  Musculoskeletal: Normal gait and station  Mental Status Exam:  Appearance: She is appropriately dressed and well-groomed, though with mild signs of decreased self-care noted. Her hair appears generally well-maintained with styled highlights, though white roots are visible at the edges. She has prominent dark circles and bags under her eyes, suggesting fatigue.  Behavior: Calm, cooperative, and engaged  Attitude: Pleasant, open, and forthcoming  Speech: Normal rate, tone, and volume.  Mood: "I'm feeling a lot better  Affect: Congruent  Thought Process: Linear, logical, and goal-directed.   Thought Content: No delusions, obsessions, or preoccupations outside of grief-related thoughts  SI/HI: Denies  Perceptions: Denies  Judgement: Intact  Insight: Good  Fund of Knowledge: WNL     Lab Results:  No visits with results within 1 Day(s) from this visit.  Latest known visit  with results is:  Admission on 10/13/2024, Discharged on 10/13/2024  Component  Date Value Ref Range Status   WBC 10/13/2024 8.2  4.0 - 10.5 K/uL Final   RBC 10/13/2024 4.59  3.87 - 5.11 MIL/uL Final   Hemoglobin 10/13/2024 14.6  12.0 - 15.0 g/dL Final   HCT 89/85/7974 45.0  36.0 - 46.0 % Final   MCV 10/13/2024 98.0  80.0 - 100.0 fL Final   MCH 10/13/2024 31.8  26.0 - 34.0 pg Final   MCHC 10/13/2024 32.4  30.0 - 36.0 g/dL Final   RDW 89/85/7974 13.7  11.5 - 15.5 % Final   Platelets 10/13/2024 294  150 - 400 K/uL Final   nRBC 10/13/2024 0.0  0.0 - 0.2 % Final   Neutrophils Relative % 10/13/2024 75  % Final   Neutro Abs 10/13/2024 6.1  1.7 - 7.7 K/uL Final   Lymphocytes Relative 10/13/2024 16  % Final   Lymphs Abs 10/13/2024 1.3  0.7 - 4.0 K/uL Final   Monocytes Relative 10/13/2024 8  % Final   Monocytes Absolute 10/13/2024 0.7  0.1 - 1.0 K/uL Final   Eosinophils Relative 10/13/2024 1  % Final   Eosinophils Absolute 10/13/2024 0.1  0.0 - 0.5 K/uL Final   Basophils Relative 10/13/2024 0  % Final   Basophils Absolute 10/13/2024 0.0  0.0 - 0.1 K/uL Final   Immature Granulocytes 10/13/2024 0  % Final   Abs Immature Granulocytes 10/13/2024 0.02  0.00 - 0.07 K/uL Final   Sodium 10/13/2024 137  135 - 145 mmol/L Final   Potassium 10/13/2024 4.6  3.5 - 5.1 mmol/L Final   Chloride 10/13/2024 99  98 - 111 mmol/L Final   CO2 10/13/2024 23  22 - 32 mmol/L Final   Glucose, Bld 10/13/2024 54 (L)  70 - 99 mg/dL Final   BUN 89/85/7974 16  6 - 20 mg/dL Final   Creatinine, Ser 10/13/2024 0.77  0.44 - 1.00 mg/dL Final   Calcium 89/85/7974 9.1  8.9 - 10.3 mg/dL Final   Total Protein 89/85/7974 6.7  6.5 - 8.1 g/dL Final   Albumin 89/85/7974 4.0  3.5 - 5.0 g/dL Final   AST 89/85/7974 21  15 - 41 U/L Final   ALT 10/13/2024 19  0 - 44 U/L Final   Alkaline Phosphatase 10/13/2024 65  38 - 126 U/L Final   Total Bilirubin 10/13/2024 0.9  0.0 - 1.2 mg/dL Final   GFR, Estimated 10/13/2024 >60  >60 mL/min Final    Anion gap 10/13/2024 15  5 - 15 Final   Hgb A1c MFr Bld 10/13/2024 4.7 (L)  4.8 - 5.6 % Final   Mean Plasma Glucose 10/13/2024 88.19  mg/dL Final   Magnesium 89/85/7974 2.0  1.7 - 2.4 mg/dL Final   Alcohol, Ethyl (B) 10/13/2024 <15  <15 mg/dL Final   Cholesterol 89/85/7974 242 (H)  0 - 200 mg/dL Final   Triglycerides 89/85/7974 79  <150 mg/dL Final   HDL 89/85/7974 104  >40 mg/dL Final   Total CHOL/HDL Ratio 10/13/2024 2.3  RATIO Final   VLDL 10/13/2024 16  0 - 40 mg/dL Final   LDL Cholesterol 10/13/2024 122 (H)  0 - 99 mg/dL Final   TSH 89/85/7974 1.526  0.350 - 4.500 uIU/mL Final   Color, Urine 10/13/2024 YELLOW  YELLOW Final   APPearance 10/13/2024 CLEAR  CLEAR Final   Specific Gravity, Urine 10/13/2024 1.015  1.005 - 1.030 Final   pH 10/13/2024 6.0  5.0 - 8.0 Final   Glucose, UA 10/13/2024 NEGATIVE  NEGATIVE mg/dL Final   Hgb urine  dipstick 10/13/2024 SMALL (A)  NEGATIVE Final   Bilirubin Urine 10/13/2024 NEGATIVE  NEGATIVE Final   Ketones, ur 10/13/2024 5 (A)  NEGATIVE mg/dL Final   Protein, ur 89/85/7974 NEGATIVE  NEGATIVE mg/dL Final   Nitrite 89/85/7974 NEGATIVE  NEGATIVE Final   Leukocytes,Ua 10/13/2024 NEGATIVE  NEGATIVE Final   RBC / HPF 10/13/2024 0-5  0 - 5 RBC/hpf Final   WBC, UA 10/13/2024 0-5  0 - 5 WBC/hpf Final   Bacteria, UA 10/13/2024 NONE SEEN  NONE SEEN Final   Squamous Epithelial / HPF 10/13/2024 0-5  0 - 5 /HPF Final   Mucus 10/13/2024 PRESENT   Final   Preg Test, Ur 10/13/2024 Negative  Negative Final   POC Amphetamine UR 10/13/2024 None Detected  NONE DETECTED (Cut Off Level 1000 ng/mL) Final   POC Secobarbital (BAR) 10/13/2024 None Detected  NONE DETECTED (Cut Off Level 300 ng/mL) Final   POC Buprenorphine (BUP) 10/13/2024 None Detected  NONE DETECTED (Cut Off Level 10 ng/mL) Final   POC Oxazepam (BZO) 10/13/2024 None Detected  NONE DETECTED (Cut Off Level 300 ng/mL) Final   POC Cocaine UR 10/13/2024 None Detected  NONE DETECTED (Cut Off Level 300 ng/mL)  Final   POC Methamphetamine UR 10/13/2024 None Detected  NONE DETECTED (Cut Off Level 1000 ng/mL) Final   POC Morphine 10/13/2024 None Detected  NONE DETECTED (Cut Off Level 300 ng/mL) Final   POC Methadone UR 10/13/2024 None Detected  NONE DETECTED (Cut Off Level 300 ng/mL) Final   POC Oxycodone UR 10/13/2024 None Detected  NONE DETECTED (Cut Off Level 100 ng/mL) Final   POC Marijuana UR 10/13/2024 None Detected  NONE DETECTED (Cut Off Level 50 ng/mL) Final   Vit D, 25-Hydroxy 10/13/2024 58.15  30 - 100 ng/mL Final   Vitamin B-12 10/13/2024 463  180 - 914 pg/mL Final     Vitals: Blood pressure 119/84, pulse 62, temperature 97.8 F (36.6 C), temperature source Oral, resp. rate 16, height 5' 8.5 (1.74 m), weight 65.8 kg, SpO2 100%.   NB: This note was created using a voice recognition software as a result there may be grammatical errors inadvertently enclosed that do not reflect the nature of this encounter. Every attempt is made to correct such errors.   Alan Maiden, MD PGY-1, Psychiatry Residency  10/15/2024, 7:58 AM

## 2024-10-16 DIAGNOSIS — F4381 Prolonged grief disorder: Secondary | ICD-10-CM

## 2024-10-16 MED ORDER — IBUPROFEN 800 MG PO TABS
800.0000 mg | ORAL_TABLET | Freq: Once | ORAL | Status: AC
  Administered 2024-10-16: 800 mg via ORAL
  Filled 2024-10-16: qty 1

## 2024-10-16 NOTE — BH Assessment (Signed)
(  Sleep Hours) - 8.25 (Any PRNs that were needed, meds refused, or side effects to meds)-  (Any disturbances and when (visitation, over night)- None (Concerns raised by the patient)- None (SI/HI/AVH)- Denies

## 2024-10-16 NOTE — Plan of Care (Signed)
   Problem: Education: Goal: Knowledge of [facility] Education information/materials will improve Outcome: Progressing Goal: Emotional status will improve Outcome: Progressing Goal: Mental status will improve Outcome: Progressing Goal: Verbalization of understanding the information provided will improve Outcome: Progressing

## 2024-10-16 NOTE — Plan of Care (Signed)
  Problem: Activity: Goal: Interest or engagement in activities will improve Outcome: Progressing Goal: Sleeping patterns will improve Outcome: Progressing   Problem: Coping: Goal: Ability to verbalize frustrations and anger appropriately will improve Outcome: Progressing Goal: Ability to demonstrate self-control will improve Outcome: Progressing   Problem: Safety: Goal: Periods of time without injury will increase Outcome: Progressing

## 2024-10-16 NOTE — Group Note (Signed)
 Recreation Therapy Group Note   Group Topic:Health and Wellness  Group Date: 10/16/2024 Start Time: 0940 End Time: 1005 Facilitators: Olena Willy-McCall, LRT,CTRS Location: [address] Dayroom   Group Topic: Wellness  Goal Area(s) Addresses:  Patient will define components of whole wellness. Patient will verbalize benefit of whole wellness.  Behavioral Response: Engaged  Intervention: Music  Activity: Exercise. Patients took turns leading group in the exercises of their choosing. Patients determined the difficulty of the exercises presented in group. Patients were encouraged not to do any exercise that was to difficult or aggravated any previous injuries. Patients were also encouraged to take breaks or get water as needed.     Education: Wellness, Building control surveyor.   Education Outcome: Acknowledges education/In group clarification offered/Needs additional education.    Affect/Mood: Appropriate   Participation Level: Engaged   Participation Quality: Independent   Behavior: Appropriate   Speech/Thought Process: Focused   Insight: Good   Judgement: Good   Modes of Intervention: Music   Patient Response to Interventions:  Engaged   Education Outcome:  In group clarification offered    Clinical Observations/Individualized Feedback: Pt was bright and engaged during group. Pt led group in side lunges. Pt was social with peers and attentive to exercises.      Plan: Continue to engage patient in RT group sessions 2-3x/week.   Maryan Sivak-McCall, LRT,CTRS 10/16/2024 11:36 AM

## 2024-10-16 NOTE — BHH Suicide Risk Assessment (Signed)
 [facility] Discharge Suicide Risk Assessment   Principal Problem: <principal problem not specified> Discharge Diagnoses: Active Problems:   Complicated grief   Demographic Factors:  Divorced or widowed and Caucasian  Loss Factors: Loss of significant relationship  Historical Factors: NA  Risk Reduction Factors:   Responsible for children under 52 years of age, Sense of responsibility to family, Living with another person, especially a relative, Positive social support, and Positive therapeutic relationship  Continued Clinical Symptoms:  Grief  Cognitive Features That Contribute To Risk:  None    Suicide Risk:  Mild:  Suicidal ideation of limited frequency, intensity, duration, and specificity.  There are no identifiable plans, no associated intent, mild dysphoria and related symptoms, good self-control (both objective and subjective assessment), few other risk factors, and identifiable protective factors, including available and accessible social support.   Follow-up Information     [facility]. Go on 10/26/2024.   Why: You have an appointment on 10/26/24  at 1:00 pm for therapy services with [facility].  The appointment will be held in person. Contact information: [address], [place], [place] [address] Phone: [phone]        [facility] Follow up on 11/02/2024.   Why: You have an appointment for medication management services on 11/02/24 at 11:00 am. The appointment will be Virtual, but you may call to switch to in person. Contact information: [address] [place] [address] [phone]         [facility] Follow up.   Why: You may call this provider to personally schedule an appointment for family therapy services. Contact information: [address] [place] [address] [phone]                 Plan Of Care/Follow-up recommendations:  Activity:  As tolerated Diet:   Regular  Oliva DELENA Salmon, DO 10/16/2024, 1:08 PM

## 2024-10-16 NOTE — Progress Notes (Signed)
  [facility] Adult Case Management Discharge Plan :  Will you be returning to the same living situation after discharge:  Yes,  patient is returning home to address on file. At discharge, do you have transportation home?: Yes,  patient's cousin, Caitlin Castro, will be providing transportation at 2pm.  Do you have the ability to pay for your medications: Yes,  patient has active health insurance.  Release of information consent forms completed and in the chart;  Patient's signature needed at discharge.  Patient to Follow up at:  Follow-up Information     [facility]. Go on 10/26/2024.   Why: You have an appointment on 10/26/24  at 1:00 pm for therapy services with [facility].  The appointment will be held in person. Contact information: [address] [place], [place] [address] Phone: [phone]        [facility] Follow up on 11/02/2024.   Why: You have an appointment for medication management services on 11/02/24 at 11:00 am. The appointment will be Virtual, but you may call to switch to in person. Contact information: [address] [place] [address] [phone]         [facility] Follow up.   Why: You may call this provider to personally schedule an appointment for family therapy services. Contact information: [address] [place] [address] [phone]                 Next level of care provider has access to [facility] Link:no  Safety Planning and Suicide Prevention discussed: Yes,  completed with patient's cousin, Caitlin Castro, [phone]     Has patient been referred to the Quitline?: Patient does not use tobacco/nicotine products  Patient has been referred for addiction treatment: No known substance use disorder.  Caitlin Castro, LCSWA 10/16/2024, 1:13 PM

## 2024-10-16 NOTE — Group Note (Signed)
 Date:  10/16/2024 Time:  9:52 AM  Group Topic/Focus:  Goals Group:   The focus of this group is to help patients establish daily goals to achieve during treatment and discuss how the patient can incorporate goal setting into their daily lives to aide in recovery.    Participation Level:  Active  Participation Quality:  Appropriate  Affect:  Appropriate  Cognitive:  Appropriate  Insight: Appropriate  Engagement in Group:  Improving  Modes of Intervention:  Orientation  Additional Comments:  she wants to work on staying positive with herself. Also wants to do a discharge plan.  Caitlin Castro [name] Service 10/16/2024, 9:52 AM

## 2024-10-16 NOTE — Progress Notes (Signed)
 Patient discharged to home accompanied by family member. Discharge instructions, all required discharge documents and information about follow-up appointment given to pt with verbalization of understanding. All personal belongings returned to pt at time of discharge. Pt escorted to lobby by RN at 1435.  10/16/24 0902  Psych Admission Type (Psych Patients Only)  Admission Status Voluntary  Psychosocial Assessment  Patient Complaints Anxiety;Depression  Eye Contact Fair  Facial Expression Anxious  Affect Anxious  Speech Logical/coherent  Interaction Assertive  Motor Activity Slow  Appearance/Hygiene Unremarkable  Behavior Characteristics Cooperative;Anxious  Mood Depressed;Anxious;Pleasant  Thought Process  Coherency WDL  Content WDL  Delusions None reported or observed  Perception WDL  Hallucination None reported or observed  Judgment Impaired  Confusion None  Danger to Self  Current suicidal ideation? Denies  Agreement Not to Harm Self Yes  Description of Agreement Verbal  Danger to Others  Danger to Others None reported or observed

## 2024-10-16 NOTE — [facility] Suicide Risk Assessment (Signed)
 [facility] INPATIENT:  Family/Significant Other Suicide Prevention Education  Suicide Prevention Education:  Education Completed; Caitlin Castro (cousin) [phone],  (name of family member/significant other) has been identified by the patient as the family member/significant other with whom the patient will be residing, and identified as the person(s) who will aid the patient in the event of a mental health crisis (suicidal ideations/suicide attempt).  With written consent from the patient, the family member/significant other has been provided the following suicide prevention education, prior to the and/or following the discharge of the patient.  The suicide prevention education provided includes the following: Suicide risk factors Suicide prevention and interventions National Suicide Hotline telephone number [facility] assessment telephone number [facility] Emergency Assistance 911 [place] and/or Residential Mobile Crisis Unit telephone number  Request made of family/significant other to: Remove weapons (e.g., guns, rifles, knives), all items previously/currently identified as safety concern.   Remove drugs/medications (over-the-counter, prescriptions, illicit drugs), all items previously/currently identified as a safety concern.  Caitlin Castro states there are no weapons in Caitlin Castro's home and she has already removed all medications from the home. Caitlin knows the follow up plan for Caitlin Castro and who to call in the event of a mental health crisis.   The family member/significant other verbalizes understanding of the suicide prevention education information provided.  The family member/significant other agrees to remove the items of safety concern listed above.  Caitlin Castro 10/16/2024, 11:32 AM

## 2024-10-17 NOTE — Discharge Summary (Signed)
 Physician Discharge Summary Note  Patient:  Caitlin Castro is an 54 y.o., female MRN:  [id number] DOB:  [birth year] Patient phone:  [phone] (home)  Patient address:   [address] [facility] [address] [place] [address],  Total Time spent with patient: 45 minutes  Date of Admission:  10/14/2024 Date of Discharge: 10/16/2024  Reason for Admission:  Caitlin Castro is a 54 y.o. female with a past psychiatric history of MDD and GAD. Patient initially arrived to [facility] on 10/14 for worsening depression and anxiety, and admitted to [facility] on 10/15 for acute safety concerns. The patient's depressive symptoms and suicidal ideation were in the context of losing her husband this past year and ongoing familial stressors.   Principal Problem: Suicidal ideation Discharge Diagnoses: Active Problems:   Complicated grief   Past Psychiatric History:  Previous psychiatric diagnoses: MDD, GAD Prior psychiatric treatment: Cymbalta, Effexor, Lexapro, paroxetine, Celexa Psychiatric medication compliance history: Compliant   Current psychiatric treatment: Fluoxetine 20 mg, duloxetine 30 mg, trazodone, Ativan as needed Current psychiatrist: None, PCP manages MDD and anxiety Current therapist: Donnal Gleason    Previous hospitalizations: Denies History of suicide attempts: None History of self harm: Denies  Past Medical History: History reviewed. No pertinent past medical history. History reviewed. No pertinent surgical history. Family History: History reviewed. No pertinent family history.    Hospital Course:  The patient was admitted to inpatient psychiatry voluntarily for suicidal ideation in the context of mounting psychosocial stressors. Upon admission she was continued on fluoxetine 40 mg daily and was started on Wellbutrin XL 150 mg daily to target residual symptoms of depression, poor concentration, and lethargy. She tolerated this well reported improved mood and focus over the  following couple days. The patient engaged well with other patients and participated well in groups. By HD 2 she reported a significantly improved mood with resolution of suicidal ideation and improved optimism and future orientation. She was provided referrals for therapy and medication management after discharge. Her outpatient clonazepam was held during the admission. During this admission she demonstrated safe and appropriate behaviors throughout. On the day of discharge she endorsed a mildly depressed to euthymic mood and denied suicidal ideation. No significant medical issues were discovered during this admission.     Musculoskeletal: Normal gait and station  Mental Status Exam: Appearance - Casually dressed, appropriate hygiene and grooming  Eye-Contact - Normal Attitude - Calm, polite, not guarded Speech - normal volume, prosody, inflection Mood - Much better Affect - Full Thought Process - LLGD Thought Content - No delusional TC expressed SI/HI - Denies  Perceptions - Denies AVH; not RIS Judgement/Insight - Good  Fund of knowledge - WNL Language - No impairments   Physical Exam Constitutional:      Appearance: Normal appearance. She is normal weight.  Eyes:     Extraocular Movements: Extraocular movements intact.  Pulmonary:     Effort: Pulmonary effort is normal.  Musculoskeletal:        General: Normal range of motion.  Neurological:     General: No focal deficit present.     Mental Status: She is alert and oriented to person, place, and time.    Review of Systems  Constitutional: Negative.   Respiratory: Negative.    Cardiovascular: Negative.   Musculoskeletal:  Positive for back pain.   Blood pressure 119/74, pulse (!) 59, temperature 97.8 F (36.6 C), temperature source Oral, resp. rate 16, height 5' 8.5 (1.74 m), weight 65.8 kg, SpO2 98%. Body mass index  is 21.73 kg/m.   Social History   Tobacco Use  Smoking Status Never  Smokeless Tobacco Never    Tobacco Cessation:  N/A, patient does not currently use tobacco products   Blood Alcohol level:  Lab Results  Component Value Date   [facility] <15 10/13/2024    Metabolic Disorder Labs:  Lab Results  Component Value Date   HGBA1C 4.7 (L) 10/13/2024   MPG 88.19 10/13/2024   Lab Results  Component Value Date   PROLACTIN 14.5 10/13/2024   Lab Results  Component Value Date   CHOL 242 (H) 10/13/2024   TRIG 79 10/13/2024   HDL 104 10/13/2024   CHOLHDL 2.3 10/13/2024   VLDL 16 10/13/2024   LDLCALC 122 (H) 10/13/2024    See Psychiatric Specialty Exam and Suicide Risk Assessment completed by Attending Physician prior to discharge.  Discharge destination:  Home  Is patient on multiple antipsychotic therapies at discharge:  No     Allergies as of 10/16/2024       Reactions   Amoxicillin-pot Clavulanate Hives        Medication List     STOP taking these medications    acetaminophen 500 MG tablet Commonly known as: TYLENOL   DULoxetine 30 MG capsule Commonly known as: CYMBALTA       TAKE these medications      Indication  Ajovy 225 MG/1.5ML Sosy Generic drug: Fremanezumab-vfrm Inject 225 mg into the skin every 30 (thirty) days. Takes on the 15th of each month  Indication: Migraine Headache   azelastine 0.1 % nasal spray Commonly known as: ASTELIN Place 1 spray into both nostrils 2 (two) times daily as needed for allergies or rhinitis.  Indication: Hayfever   buPROPion 150 MG 24 hr tablet Commonly known as: WELLBUTRIN XL Take 1 tablet (150 mg total) by mouth daily.  Indication: Depression   clonazePAM 0.5 MG tablet Commonly known as: KLONOPIN Take 0.5 mg by mouth 2 (two) times daily as needed for anxiety.  Indication: Feeling Anxious   Dotti 0.1 MG/24HR patch Generic drug: estradiol Place 1 patch onto the skin 2 (two) times a week. Applies on Wednesday and Sunday  Indication: Female Hypogonadism   FLUoxetine 40 MG capsule Commonly known as:  PROZAC Take 40 mg by mouth daily.  Indication: Depression   naproxen sodium 220 MG tablet Commonly known as: ALEVE Take 440 mg by mouth 2 (two) times daily as needed (For pain).  Indication: Pain   valACYclovir 500 MG tablet Commonly known as: VALTREX Take 500 mg by mouth daily.  Indication: Herpes Simplex Infection        Follow-up Information     [facility]. Go on 10/26/2024.   Why: You have an appointment on 10/26/24  at 1:00 pm for therapy services with [facility]  The appointment will be held in person. Contact information: [address] [place], [place] [address] Phone: [phone]        [facility] Follow up on 11/02/2024.   Why: You have an appointment for medication management services on 11/02/24 at 11:00 am. The appointment will be Virtual, but you may call to switch to in person. Contact information: [address] [place] [address] [phone]         [facility] Follow up.   Why: You may call this provider to personally schedule an appointment for family therapy services. Contact information: [address] [place] [address] [phone]  Follow-up recommendations:  Activity:  As tolerated Diet:  Regular   Signed: Oliva DELENA Salmon, DO 10/17/2024, 5:02 PM

## 2025-01-04 ENCOUNTER — Other Ambulatory Visit (HOSPITAL_BASED_OUTPATIENT_CLINIC_OR_DEPARTMENT_OTHER): Payer: Self-pay

## 2025-01-05 ENCOUNTER — Other Ambulatory Visit (HOSPITAL_BASED_OUTPATIENT_CLINIC_OR_DEPARTMENT_OTHER): Payer: Self-pay

## 2025-01-05 MED ORDER — FLUOXETINE HCL 40 MG PO CAPS
40.0000 mg | ORAL_CAPSULE | Freq: Every day | ORAL | 2 refills | Status: AC
  Filled 2025-01-05: qty 90, 90d supply, fill #0

## 2025-01-05 MED ORDER — FLUOXETINE HCL 40 MG PO CAPS
40.0000 mg | ORAL_CAPSULE | Freq: Every day | ORAL | 0 refills | Status: AC
  Filled 2025-01-05: qty 30, 30d supply, fill #0

## 2025-01-13 ENCOUNTER — Other Ambulatory Visit (HOSPITAL_BASED_OUTPATIENT_CLINIC_OR_DEPARTMENT_OTHER): Payer: Self-pay

## 2025-01-28 ENCOUNTER — Other Ambulatory Visit (HOSPITAL_BASED_OUTPATIENT_CLINIC_OR_DEPARTMENT_OTHER): Payer: Self-pay

## 2025-01-28 MED ORDER — VALACYCLOVIR HCL 500 MG PO TABS
500.0000 mg | ORAL_TABLET | Freq: Every day | ORAL | 2 refills | Status: AC
  Filled 2025-01-28: qty 30, 30d supply, fill #0

## 2025-02-03 ENCOUNTER — Other Ambulatory Visit (HOSPITAL_BASED_OUTPATIENT_CLINIC_OR_DEPARTMENT_OTHER): Payer: Self-pay

## 2025-02-03 MED ORDER — AJOVY 225 MG/1.5ML ~~LOC~~ SOAJ
225.0000 mg | SUBCUTANEOUS | 0 refills | Status: AC
  Filled 2025-02-03: qty 1.5, 30d supply, fill #0
